# Patient Record
Sex: Male | Born: 1980 | Race: White | Hispanic: No | Marital: Single | Smoking: Current every day smoker
Health system: Southern US, Community
[De-identification: ages and names within clinical notes are randomized; demographics above are authoritative.]

## PROBLEM LIST (undated history)

## (undated) DIAGNOSIS — Q249 Congenital malformation of heart, unspecified: Secondary | ICD-10-CM

## (undated) DIAGNOSIS — I499 Cardiac arrhythmia, unspecified: Secondary | ICD-10-CM

## (undated) HISTORY — PX: SURGERY SCROTAL / TESTICULAR: SUR1316

---

## 2010-07-26 ENCOUNTER — Emergency Department (HOSPITAL_COMMUNITY)
Admission: EM | Admit: 2010-07-26 | Discharge: 2010-07-26 | Payer: Self-pay | Source: Home / Self Care | Admitting: Emergency Medicine

## 2015-02-21 ENCOUNTER — Encounter (HOSPITAL_COMMUNITY): Payer: Self-pay | Admitting: Emergency Medicine

## 2015-02-21 ENCOUNTER — Inpatient Hospital Stay (HOSPITAL_COMMUNITY)
Admission: EM | Admit: 2015-02-21 | Discharge: 2015-02-22 | DRG: 201 | Discharge status: Against Medical Advice | Payer: Self-pay | Attending: Pulmonary Disease | Admitting: Pulmonary Disease

## 2015-02-21 ENCOUNTER — Emergency Department (HOSPITAL_COMMUNITY): Payer: Self-pay

## 2015-02-21 DIAGNOSIS — J9383 Other pneumothorax: Principal | ICD-10-CM

## 2015-02-21 DIAGNOSIS — F17210 Nicotine dependence, cigarettes, uncomplicated: Secondary | ICD-10-CM | POA: Diagnosis present

## 2015-02-21 DIAGNOSIS — J939 Pneumothorax, unspecified: Secondary | ICD-10-CM

## 2015-02-21 DIAGNOSIS — R0781 Pleurodynia: Secondary | ICD-10-CM | POA: Diagnosis present

## 2015-02-21 HISTORY — DX: Cardiac arrhythmia, unspecified: I49.9

## 2015-02-21 HISTORY — DX: Congenital malformation of heart, unspecified: Q24.9

## 2015-02-21 LAB — CBC
HCT: 49 % (ref 39.0–52.0)
Hemoglobin: 17 g/dL (ref 13.0–17.0)
MCH: 32.8 pg (ref 26.0–34.0)
MCHC: 34.7 g/dL (ref 30.0–36.0)
MCV: 94.4 fL (ref 78.0–100.0)
Platelets: 228 10*3/uL (ref 150–400)
RBC: 5.19 MIL/uL (ref 4.22–5.81)
RDW: 13.6 % (ref 11.5–15.5)
WBC: 12.7 10*3/uL — ABNORMAL HIGH (ref 4.0–10.5)

## 2015-02-21 LAB — BASIC METABOLIC PANEL
Anion gap: 12 (ref 5–15)
BUN: 17 mg/dL (ref 6–20)
CALCIUM: 9.4 mg/dL (ref 8.9–10.3)
CO2: 23 mmol/L (ref 22–32)
Chloride: 102 mmol/L (ref 101–111)
Creatinine, Ser: 0.81 mg/dL (ref 0.61–1.24)
GLUCOSE: 80 mg/dL (ref 65–99)
POTASSIUM: 3.8 mmol/L (ref 3.5–5.1)
SODIUM: 137 mmol/L (ref 135–145)

## 2015-02-21 LAB — I-STAT TROPONIN, ED: TROPONIN I, POC: 0 ng/mL (ref 0.00–0.08)

## 2015-02-21 MED ORDER — HYDROMORPHONE HCL 1 MG/ML IJ SOLN
1.0000 mg | Freq: Once | INTRAMUSCULAR | Status: AC
  Administered 2015-02-21: 1 mg via INTRAVENOUS
  Filled 2015-02-21: qty 1

## 2015-02-21 NOTE — ED Notes (Signed)
Pulmonologist at bedside

## 2015-02-21 NOTE — ED Notes (Signed)
Pt presents to ED c/o left sided chest pain rated 9/10 and radiating to left arm and left neck.  Also reports dizziness, lightheadedness, and sweating.  Pt states he has had similar chest pain before years ago and has small cardiac ventricles, arrhythmias, and occasional syncope.  He does not take medications.  No nausea or vomiting at this time. ED EKG performed in triage.

## 2015-02-21 NOTE — ED Provider Notes (Signed)
CSN: [id number]     Arrival date & time 02/21/15  2224 History   First MD Initiated Contact with Patient 02/21/15 2315     Chief Complaint  Patient presents with  . Chest Pain     (Consider location/radiation/quality/duration/timing/severity/associated sxs/prior Treatment) HPI Patient presents with acute onset left sided sharp chest pain starting around 8:30 this evening. Patient states he was watching television when the pain started. Pain radiates to neck and left arm. Pain is worse with deep breathing. No lower extremity swelling or pain. Patient is a current 1 pack per day smoker for the past 20 years. No recent surgeries or extended travel. Past Medical History  Diagnosis Date  . Congenital heart defect     small ventricles  . Cardiac arrhythmia    Past Surgical History  Procedure Laterality Date  . Surgery scrotal / testicular     History reviewed. No pertinent family history. History  Substance Use Topics  . Smoking status: Current Every Day Smoker -- 1.00 packs/day for 20 years    Types: Cigarettes  . Smokeless tobacco: Never Used  . Alcohol Use: Yes     Comment: occasional    Review of Systems  Constitutional: Negative for fever and chills.  Respiratory: Positive for shortness of breath. Negative for cough.   Cardiovascular: Positive for chest pain. Negative for palpitations and leg swelling.  Gastrointestinal: Negative for nausea, vomiting and abdominal pain.  Musculoskeletal: Positive for neck pain. Negative for back pain and neck stiffness.  Skin: Negative for rash and wound.  Neurological: Negative for dizziness, weakness, light-headedness, numbness and headaches.  All other systems reviewed and are negative.     Allergies  Review of patient's allergies indicates no known allergies.  Home Medications   Prior to Admission medications   Not on File   BP 141/78 mmHg  Pulse 83  Temp(Src) 98.4 F (36.9 C) (Oral)  Resp 16  Ht 6\' 2"  (1.88 m)  Wt 149 lb  7.6 oz (67.8 kg)  BMI 19.18 kg/m2  SpO2 96% Physical Exam  Constitutional: He is oriented to person, place, and time. He appears well-developed and well-nourished. No distress.  HENT:  Head: Normocephalic and atraumatic.  Mouth/Throat: Oropharynx is clear and moist.  Eyes: EOM are normal. Pupils are equal, round, and reactive to light.  Neck: Normal range of motion. Neck supple.  Cardiovascular: Normal rate and regular rhythm.   Pulmonary/Chest: Effort normal. No respiratory distress. He has no wheezes. He has no rales. He exhibits no tenderness.  Decreased breath sounds in the left lung fields  Abdominal: Soft. Bowel sounds are normal. He exhibits no distension and no mass. There is no tenderness. There is no rebound and no guarding.  Musculoskeletal: Normal range of motion. He exhibits no edema or tenderness.  No calf swelling or tenderness.  Neurological: He is alert and oriented to person, place, and time.  Skin: Skin is warm and dry. No rash noted. No erythema.  Psychiatric: He has a normal mood and affect. His behavior is normal.  Nursing note and vitals reviewed.   ED Course  Procedures (including critical care time) Labs Review Labs Reviewed  CBC - Abnormal; Notable for the following:    WBC 12.7 (*)    All other components within normal limits  MRSA PCR SCREENING  BASIC METABOLIC PANEL  Rosezena SensorI-STAT TROPOININ, ED    Imaging Review Dg Chest Port 1 View  02/22/2015   CLINICAL DATA:  Pneumothorax, followup  EXAM: PORTABLE CHEST - 1  VIEW  COMPARISON:  Portable exam 1219 hours compared to 0438 hours  FINDINGS: Normal heart size, mediastinal contours and pulmonary vascularity.  Persistent small to moderate LEFT apex pneumothorax little changed from previous exam.  Underlying emphysematous changes.  Minimal subsegmental atelectasis at LEFT base.  Lungs otherwise clear.  No pleural effusion or acute osseous findings.  IMPRESSION: Persistent small to moderate LEFT apex pneumothorax not  significantly changed.   Electronically Signed   By: Ulyses Southward M.D.   On: 02/22/2015 12:28   Dg Chest Port 1 View  02/22/2015   CLINICAL DATA:  Pneumothorax.  EXAM: PORTABLE CHEST - 1 VIEW  COMPARISON:  02/21/2015.  FINDINGS: Trachea is midline. Heart size normal. Moderate left apical pneumothorax has increased slightly in the interval. Right apical pleural parenchymal scarring. There may be atelectasis in the lingula and medial left lower lobe. Lungs are otherwise clear. No definite pleural fluid.  IMPRESSION: Moderate left pneumothorax, slightly increased in size from yesterday's exam.   Electronically Signed   By: Leanna Battles M.D.   On: 02/22/2015 07:07   Dg Chest Port 1 View  02/21/2015   CLINICAL DATA:  Left-sided chest pain radiating into the neck and left arm.  EXAM: PORTABLE CHEST - 1 VIEW  COMPARISON:  None.  FINDINGS: There is a left pneumothorax, approximately 20% volume. The right lung is well expanded and clear. There is no midline shift. Hilar, mediastinal and cardiac contours are normal. There is no large effusion.  IMPRESSION: Left pneumothorax. These results were called by telephone at the time of interpretation on 02/21/2015 at 11:09 pm to the ED physician, who verbally acknowledged these results.   Electronically Signed   By: Ellery Plunk M.D.   On: 02/21/2015 23:10     EKG Interpretation   Date/Time:  Tuesday February 21 2015 22:39:43 EDT Ventricular Rate:  94 PR Interval:  136 QRS Duration: 86 QT Interval:  360 QTC Calculation: 450 R Axis:   50 Text Interpretation:  Normal sinus rhythm Nonspecific ST and T wave  abnormality Abnormal ECG Confirmed by Dejae Bernet  MD, Kirstan Fentress ([id number]) on  02/21/2015 11:53:42 PM      MDM   Final diagnoses:  Spontaneous pneumothorax  Pneumothorax  Pneumothorax, left    Pt with L sided PTX without shift. VS stable. Discussed with CCM and will see in ED.     Loren Racer, MD 02/22/15 [phone]

## 2015-02-22 ENCOUNTER — Inpatient Hospital Stay (HOSPITAL_COMMUNITY): Payer: MEDICAID

## 2015-02-22 ENCOUNTER — Inpatient Hospital Stay (HOSPITAL_COMMUNITY): Payer: Self-pay

## 2015-02-22 DIAGNOSIS — J9383 Other pneumothorax: Secondary | ICD-10-CM | POA: Diagnosis present

## 2015-02-22 LAB — MRSA PCR SCREENING: MRSA BY PCR: NEGATIVE

## 2015-02-22 MED ORDER — SODIUM CHLORIDE 0.9 % IV SOLN: 250.0000 mL | INTRAVENOUS | Status: DC | PRN

## 2015-02-22 MED ORDER — ENOXAPARIN SODIUM 40 MG/0.4ML ~~LOC~~ SOLN
40.0000 mg | SUBCUTANEOUS | Status: DC
  Administered 2015-02-22: 40 mg via SUBCUTANEOUS
  Filled 2015-02-22: qty 0.4

## 2015-02-22 MED ORDER — ACETAMINOPHEN 325 MG PO TABS: 650.0000 mg | ORAL_TABLET | ORAL | Status: DC | PRN

## 2015-02-22 MED ORDER — HYDROMORPHONE HCL 1 MG/ML IJ SOLN
0.5000 mg | INTRAMUSCULAR | Status: DC | PRN
  Administered 2015-02-22 (×3): 0.5 mg via INTRAVENOUS
  Filled 2015-02-22 (×3): qty 1

## 2015-02-22 MED ORDER — ONDANSETRON HCL 4 MG/2ML IJ SOLN: 4.0000 mg | Freq: Four times a day (QID) | INTRAMUSCULAR | Status: DC | PRN

## 2015-02-22 NOTE — Progress Notes (Addendum)
eLink Physician-Brief Progress Note Patient Name: Yisroel RammingBilly F Martinezlopez DOB: 06/26/1981 MRN: [id number]  Date of Service  02/22/2015   HPI/Events of Note   Patient feels fine and feels nothing being done for him an feels this can be managed at home  eICU Interventions  Explained PTX and risk for resp arrest.  Get STAT CXR - if worsening - place wayne tube but if resolved /stable 0- aim to move to floor tonight and dc home 02/23/15   Check urine tox - he denies cocaine but admist to tobacco       Kymere Fullington 02/22/2015, 6:46 PM

## 2015-02-22 NOTE — H&P (Signed)
PULMONARY / CRITICAL CARE MEDICINE   Name: Scott Burke MRN: [id number] DOB: 21-Jul-1981    ADMISSION DATE:  02/21/2015 CONSULTATION DATE:  02/21/2015  REFERRING MD :  EDP  CHIEF COMPLAINT:  Shortness of breath, chest pain  INITIAL PRESENTATION: 34 year old smoker with no past medical history presented to the [facility] emergency department on 02/21/2015 with chest pain, found to have a spontaneous left pneumothorax.  STUDIES:    SIGNIFICANT EVENTS:    HISTORY OF PRESENT ILLNESS:  This is a 34 year old male who has a past medical history significant for distant history of syncope felt to be related to a congenital heart defect of uncertain etiology who came to the [facility] emergency department on 02/22/2015 complaining of chest pain. He said the pain started suddenly over the course of the day on June 28. He had been feeling well the days prior. The pain progressed and was associated with some shortness of breath. His family brought him to the emergency department. He said the pain did radiate to the left chest into the left shoulder. He was concerned that he was having a heart problem. He denied any sort of recent injury, fall, or chest trauma of any kind. He says that he smokes one pack of cigarettes daily. He denies fevers chills or cough. He denies leg swelling of any kind.  PAST MEDICAL HISTORY :   has a past medical history of Congenital heart defect and Cardiac arrhythmia.  has past surgical history that includes Surgery scrotal / testicular. Prior to Admission medications   Not on File   No Known Allergies  FAMILY HISTORY:  has no family status information on file.  SOCIAL HISTORY:  reports that he has been smoking Cigarettes.  He has a 20 pack-year smoking history. He has never used smokeless tobacco. He reports that he drinks alcohol. He reports that he does not use illicit drugs.  REVIEW OF SYSTEMS:   Gen: Denies fever, chills, weight change, fatigue, night  sweats HEENT: Denies blurred vision, double vision, hearing loss, tinnitus, sinus congestion, rhinorrhea, sore throat, neck stiffness, dysphagia PULM: Per history of present illness CV: Per history of present illness GI: Denies abdominal pain, nausea, vomiting, diarrhea, hematochezia, melena, constipation, change in bowel habits GU: Denies dysuria, hematuria, polyuria, oliguria, urethral discharge Endocrine: Denies hot or cold intolerance, polyuria, polyphagia or appetite change Derm: Denies rash, dry skin, scaling or peeling skin change Heme: Denies easy bruising, bleeding, bleeding gums Neuro: Denies headache, numbness, weakness, slurred speech, loss of memory or consciousness   SUBJECTIVE:   VITAL SIGNS: Temp:  [98.7 F (37.1 C)] 98.7 F (37.1 C) (06/28 2230) Pulse Rate:  [97] 97 (06/28 2230) Resp:  [20] 20 (06/28 2230) BP: (139)/(81) 139/81 mmHg (06/28 2230) SpO2:  [99 %] 99 % (06/28 2230) Weight:  [74.844 kg (165 lb)] 74.844 kg (165 lb) (06/28 2230) HEMODYNAMICS:   VENTILATOR SETTINGS:   INTAKE / OUTPUT: No intake or output data in the 24 hours ending 02/22/15 0007  PHYSICAL EXAMINATION: General:  Resting comfortably in bed Neuro:  Awake and alert, oriented 4, moves all extreme ease well HEENT:  NCAT, EOMI, oropharynx clear Cardiovascular:  Regular rate and rhythm no murmurs gallops rubs Lungs:  Clear to auscultation bilaterally, slightly diminished left side Abdomen:  Bowel sounds positive nontender nondistended Musculoskeletal:  Normal bulk and tone Skin:  Tattoos, no rash  LABS:  CBC  Recent Labs Lab 02/21/15 2254  WBC 12.7*  HGB 17.0  HCT 49.0  PLT 228  Coag's No results for input(s): APTT, INR in the last 168 hours. BMET  Recent Labs Lab 02/21/15 2254  NA 137  K 3.8  CL 102  CO2 23  BUN 17  CREATININE 0.81  GLUCOSE 80   Electrolytes  Recent Labs Lab 02/21/15 2254  CALCIUM 9.4   Sepsis Markers No results for input(s): LATICACIDVEN,  PROCALCITON, O2SATVEN in the last 168 hours. ABG No results for input(s): PHART, PCO2ART, PO2ART in the last 168 hours. Liver Enzymes No results for input(s): AST, ALT, ALKPHOS, BILITOT, ALBUMIN in the last 168 hours. Cardiac Enzymes No results for input(s): TROPONINI, PROBNP in the last 168 hours. Glucose No results for input(s): GLUCAP in the last 168 hours.  Imaging 02/21/2015 chest x-ray images personally reviewed there is a small left-sided pneumothorax no mediastinal shift   ASSESSMENT / PLAN:  PULMONARY  A: Left spontaneous pneumothorax in a smoker. He is tall and thin and this is likely idiopathic. He is currently hemostatically stable and breathing comfortably. On my review the chest x-ray this appears to be a small pneumothorax which is not causing significant symptoms at this time. There is no evidence of another underlying lung disease.  We discussed treatment options including oxygen therapy with observation overnight versus a chest tube. Considering the fact that his vital signs are stable and he is comfortable I recommended oxygen therapy with repeat chest x-ray in the morning. He was agreeable to this.  Tobacco abuse P:   Admit to stepdown Repeat chest x-ray in the morning or if symptoms worsen When necessary a lot of for pain control Monitor O2 saturation respiratory status carefully along with vital signs Rest on 100% nonrebreather overnight Counseled to quit smoking  CARDIOVASCULAR  A: History of congenital heart defect, normal cardiac exam on my exam P:  Plantar monitoring Monitor blood pressure carefully  RENAL A:  No acute issues P:   Monitor urine output  GASTROINTESTINAL A:  No acute issues P:   Regular diet  HEMATOLOGIC A:  No acute issues P:  Monitor for bleeding  INFECTIOUS A:  No acute issues P:   Monitor for fevers or chills  ENDOCRINE A:  No acute issues   P:     NEUROLOGIC A:  Acute issues P:      FAMILY  - Updates:  Mother updated bedside Heber Ganado, MD [place] [facility] Pager: [phone] Cell: [phone] After 3pm or if no response, call [phone] 02/22/2015, 12:07 AM

## 2015-02-22 NOTE — Progress Notes (Signed)
PULMONARY / CRITICAL CARE MEDICINE   Name: Scott RammingBilly F Burke MRN: [id number] DOB: Jan 20, 1981    ADMISSION DATE:  02/21/2015  REFERRING MD :  EDP  CHIEF COMPLAINT:  Shortness of breath, chest pain  INITIAL PRESENTATION: 34 year old smoker with no past medical history [facility] on 02/21/2015 with chest pain, found to have a spontaneous left pneumothorax.  STUDIES:   SIGNIFICANT EVENTS: 6/28 Admit with PTX  SUBJECTIVE:  Pt reports intermittent sharp chest pains on L, mild SOB   VITAL SIGNS: Temp:  [97.7 F (36.5 C)-98.7 F (37.1 C)] 97.7 F (36.5 C) (06/29 0415) Pulse Rate:  [58-97] 61 (06/29 0700) Resp:  [11-24] 16 (06/29 0700) BP: (115-139)/(53-82) 123/75 mmHg (06/29 0700) SpO2:  [96 %-100 %] 99 % (06/29 0700) Weight:  [149 lb 7.6 oz (67.8 kg)-165 lb (74.844 kg)] 149 lb 7.6 oz (67.8 kg) (06/29 0118)   INTAKE / OUTPUT: No intake or output data in the 24 hours ending 02/22/15 0753  PHYSICAL EXAMINATION: General:  Thin adult male, resting comfortably in bed Neuro:  Awake and alert, oriented 4, moves all extremities  HEENT:  NCAT, EOMI, oropharynx clear Cardiovascular:  Regular rate and rhythm no murmurs / gallops / rubs Lungs:  Clear to auscultation bilaterally, slightly diminished left side Abdomen:  Bowel sounds positive nontender nondistended Musculoskeletal:  Normal bulk and tone Skin:  Tattoos, no rash  LABS:  CBC  Recent Labs Lab 02/21/15 2254  WBC 12.7*  HGB 17.0  HCT 49.0  PLT 228    BMET  Recent Labs Lab 02/21/15 2254  NA 137  K 3.8  CL 102  CO2 23  BUN 17  CREATININE 0.81  GLUCOSE 80   Electrolytes  Recent Labs Lab 02/21/15 2254  CALCIUM 9.4    Imaging Dg Chest Port 1 View  02/22/2015   CLINICAL DATA:  Pneumothorax.  EXAM: PORTABLE CHEST - 1 VIEW  COMPARISON:  02/21/2015.  FINDINGS: Trachea is midline. Heart size normal. Moderate left apical pneumothorax has increased slightly in the interval.  Right apical pleural parenchymal scarring. There may be atelectasis in the lingula and medial left lower lobe. Lungs are otherwise clear. No definite pleural fluid.  IMPRESSION: Moderate left pneumothorax, slightly increased in size from yesterday's exam.   Electronically Signed   By: Leanna BattlesMelinda  Blietz M.D.   On: 02/22/2015 07:07   Dg Chest Port 1 View  02/21/2015   CLINICAL DATA:  Left-sided chest pain radiating into the neck and left arm.  EXAM: PORTABLE CHEST - 1 VIEW  COMPARISON:  None.  FINDINGS: There is a left pneumothorax, approximately 20% volume. The right lung is well expanded and clear. There is no midline shift. Hilar, mediastinal and cardiac contours are normal. There is no large effusion.  IMPRESSION: Left pneumothorax. These results were called by telephone at the time of interpretation on 02/21/2015 at 11:09 pm to the ED physician, who verbally acknowledged these results.   Electronically Signed   By: Ellery Plunkaniel R Mitchell M.D.   On: 02/21/2015 23:10     ASSESSMENT / PLAN:  Lt spontaneous PTX >> hemodynamics stable. P:   SDU monitoring  Trend CXR PRN dilaudid for pain control Monitor O2 saturation respiratory status carefully along with vital signs Continue 100% NRB for now, repeat film in 4 hours  Hold CT for now Counseled to quit smoking  History of congenital heart defect, normal cardiac exam on my exam P:  Plantar monitoring Monitor blood pressure carefully  Tobacco abuse.  P: Discuss importance of smoking abstinence   FAMILY  - Updates:  Patient updated am 6/29 on plan   Canary Brim, NP-C [facility] Pgr: [phone] or if no answer [phone] 02/22/2015, 7:53 AM   Reviewed above, examined.  He has mild Lt sided pleuritic chest pain.  Denies dyspnea.  No wheeze, heart rate regular, abd soft, no edema, normal strength.  Continue supplemental oxygen to wash out PTX, f/u CXR.  Defer chest tube placement for now >> if PTX gets bigger on f/u CXR,  then will need pig tail catheter.  Continue to monitor in SDU for now.  Coralyn Helling, MD [facility] Pulmonary/Critical Care 02/22/2015, 9:39 AM Pager:  [phone] After 3pm call: [phone]

## 2015-02-22 NOTE — Progress Notes (Signed)
Date:  February 22, 2015 U.R. performed for needs and level of care. Will continue to follow for Case Management needs.  Caidyn Henricksen, RN, BSN, CCM   [phone] 

## 2015-02-22 NOTE — Progress Notes (Signed)
Patient is new admit from the ED. He is A/Ox4 and was transported by stretcher with non-rebreather mask and oxygen at 15 L. He was accompanied by his mother. He has had c/o pain during the shift and prn pain medication was given and effective. No signs of respiratory distress. Oxygen saturations remained above 90% between 97-100% on non-rebreather mask.

## 2015-02-22 NOTE — Progress Notes (Signed)
02/22/15 1900  Patient refused xray and decided to leave AMA. AMA form signed by patient. Patient IV's were removed. Patient was advised to not smoke, to take good deep breaths and cough, and to return to any ED if SOB or CP increases. Patient verbalized understanding.

## 2015-02-22 NOTE — Care Management Note (Signed)
Case Management Note  Patient Details  Name: Scott Burke MRN: [id number] Date of Birth: 12/20/1980  Subjective/Objective:                 pneumothorax    Action/Plan: home   Expected Discharge Date:                  Expected Discharge Plan:  Home/Self Care  In-House Referral:  NA  Discharge planning Services  CM Consult  Post Acute Care Choice:    Choice offered to:     DME Arranged:  N/A DME Agency:  NA  HH Arranged:  NA HH Agency:  NA  Status of Service:  In process, will continue to follow  Medicare Important Message Given:    Date Medicare IM Given:    Medicare IM give by:    Date Additional Medicare IM Given:    Additional Medicare Important Message give by:     If discussed at Long Length of Stay Meetings, dates discussed:    Additional Comments:  Golda AcreDavis, Mauriana Dann Lynn, RN 02/22/2015, 9:35 AM

## 2015-03-08 NOTE — Discharge Summary (Signed)
Physician Discharge Summary       Patient ID: Yisroel RammingBilly F Bozman MRN: [id number] DOB/AGE: [birth year] 34 y.o.  Admit date: 02/21/2015 Discharge date: 03/08/2015  Discharge Diagnoses:  Left spontaneous pneumothorax Tobacco abuse  Detailed Hospital Course:   34 year old smoker with no past medical history [facility] department on 02/21/2015 with chest pain, found to have a spontaneous left pneumothorax. He was admitted to the SDU for monitoring. On 6/29 he left AMA stating that "he felt fine". Refused f/u CXR to ensure safety.     Discharge Plan by active problems   Lt spontaneous PTX >> hemodynamics stable. Tobacco abuse. Patient left against medical advice: no plan was made   Significant Hospital tests/ studies  Consults   Discharge Exam: BP 141/78 mmHg  Pulse 83  Temp(Src) 98.4 F (36.9 C) (Oral)  Resp 16  Ht 6\' 2"  (1.88 m)  Wt 67.8 kg (149 lb 7.6 oz)  BMI 19.18 kg/m2  SpO2 96%  Not examined. Left against medical advice.   Labs at discharge Lab Results  Component Value Date   CREATININE 0.81 02/21/2015   BUN 17 02/21/2015   NA 137 02/21/2015   K 3.8 02/21/2015   CL 102 02/21/2015   CO2 23 02/21/2015   Lab Results  Component Value Date   WBC 12.7* 02/21/2015   HGB 17.0 02/21/2015   HCT 49.0 02/21/2015   MCV 94.4 02/21/2015   PLT 228 02/21/2015   No results found for: ALT, AST, GGT, ALKPHOS, BILITOT No results found for: INR, PROTIME  Current radiology studies No results found.  Disposition:  07-Left Against Medical Advice     Medication List    Notice    You have not been prescribed any medications.       Discharged Condition: left against medical advise   Physician Statement:   The Patient was personally examined, the discharge assessment and plan has been personally reviewed and I agree with ACNP Tauna Macfarlane's assessment and plan. > 30 minutes of time have been dedicated to discharge assessment, planning and  discharge instructions.   Signed: Koryn Charlot,PETE 03/08/2015, 3:31 PM

## 2015-11-12 IMAGING — DX DG CHEST 1V PORT
1 series · 2 of 2 positions shown · non-contrast
Comparison: 02/21/2015.

CLINICAL DATA: Pneumothorax.

EXAM:
PORTABLE CHEST - 1 VIEW

[Series 1: chest ap · 0.14mm/px · 2 of 2 slices shown]
[im 1/2]
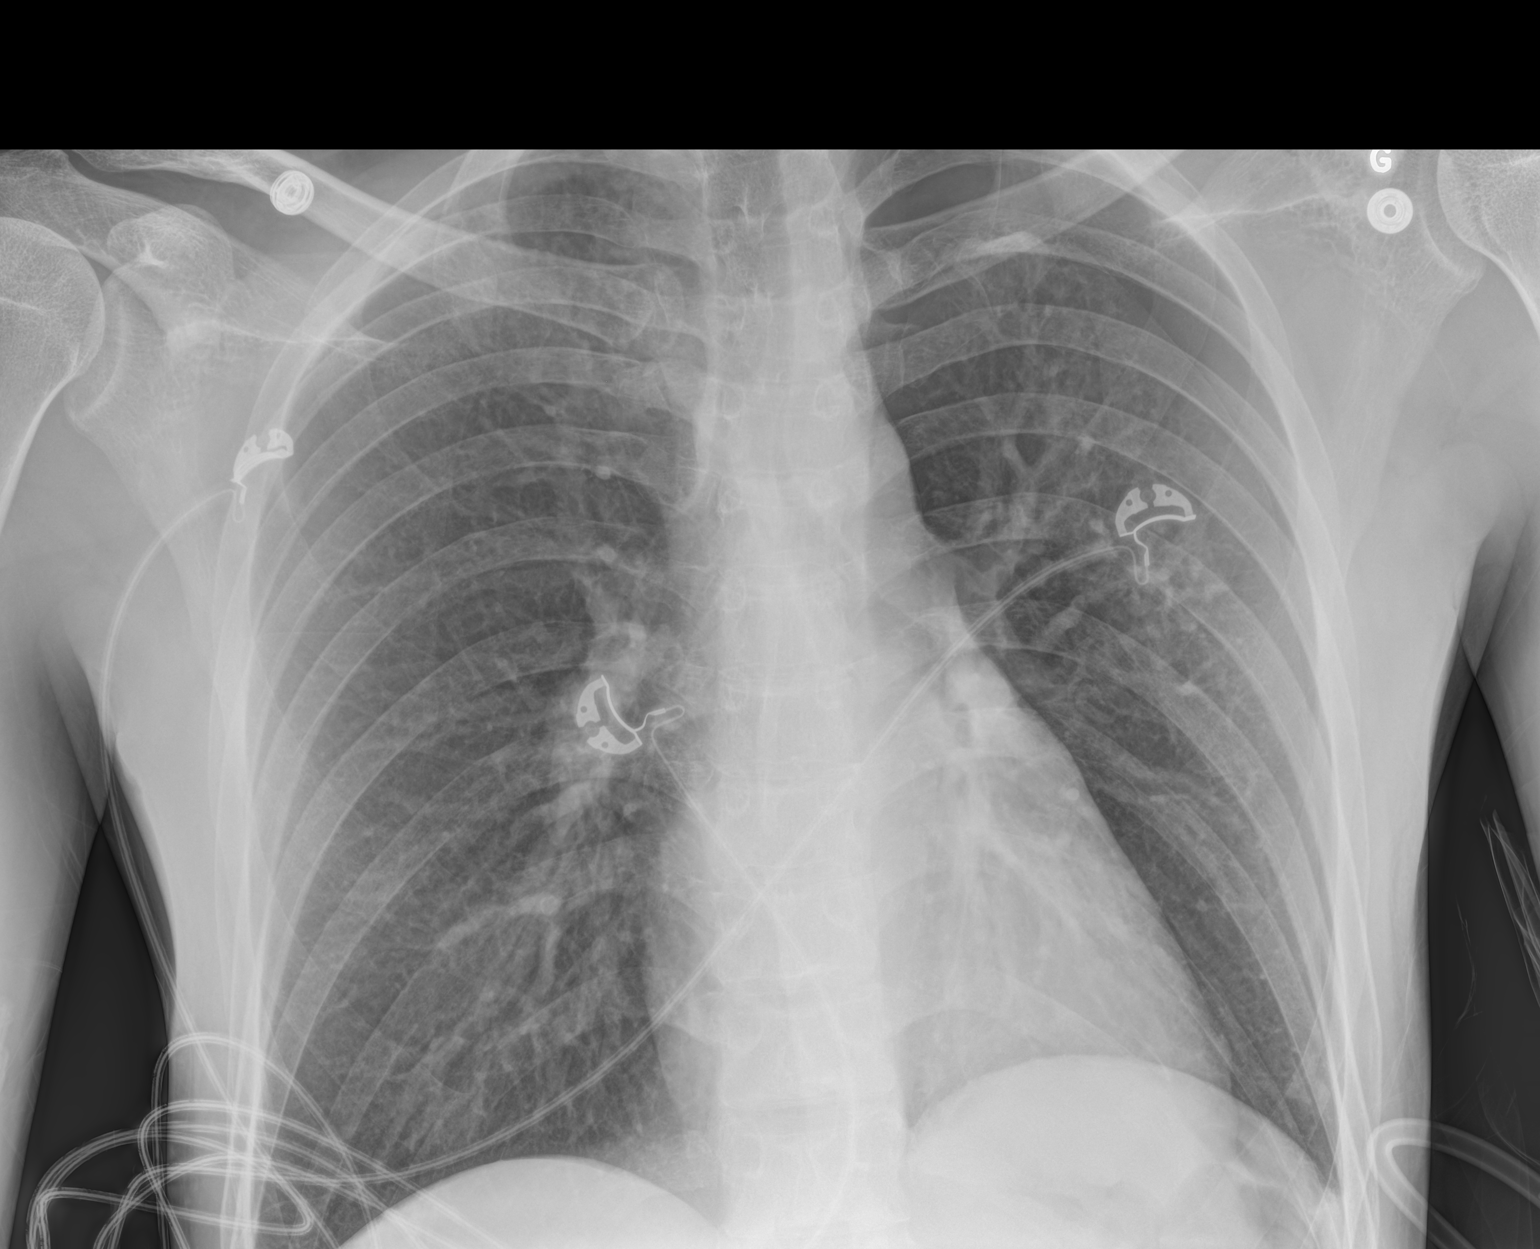
[im 2/2]
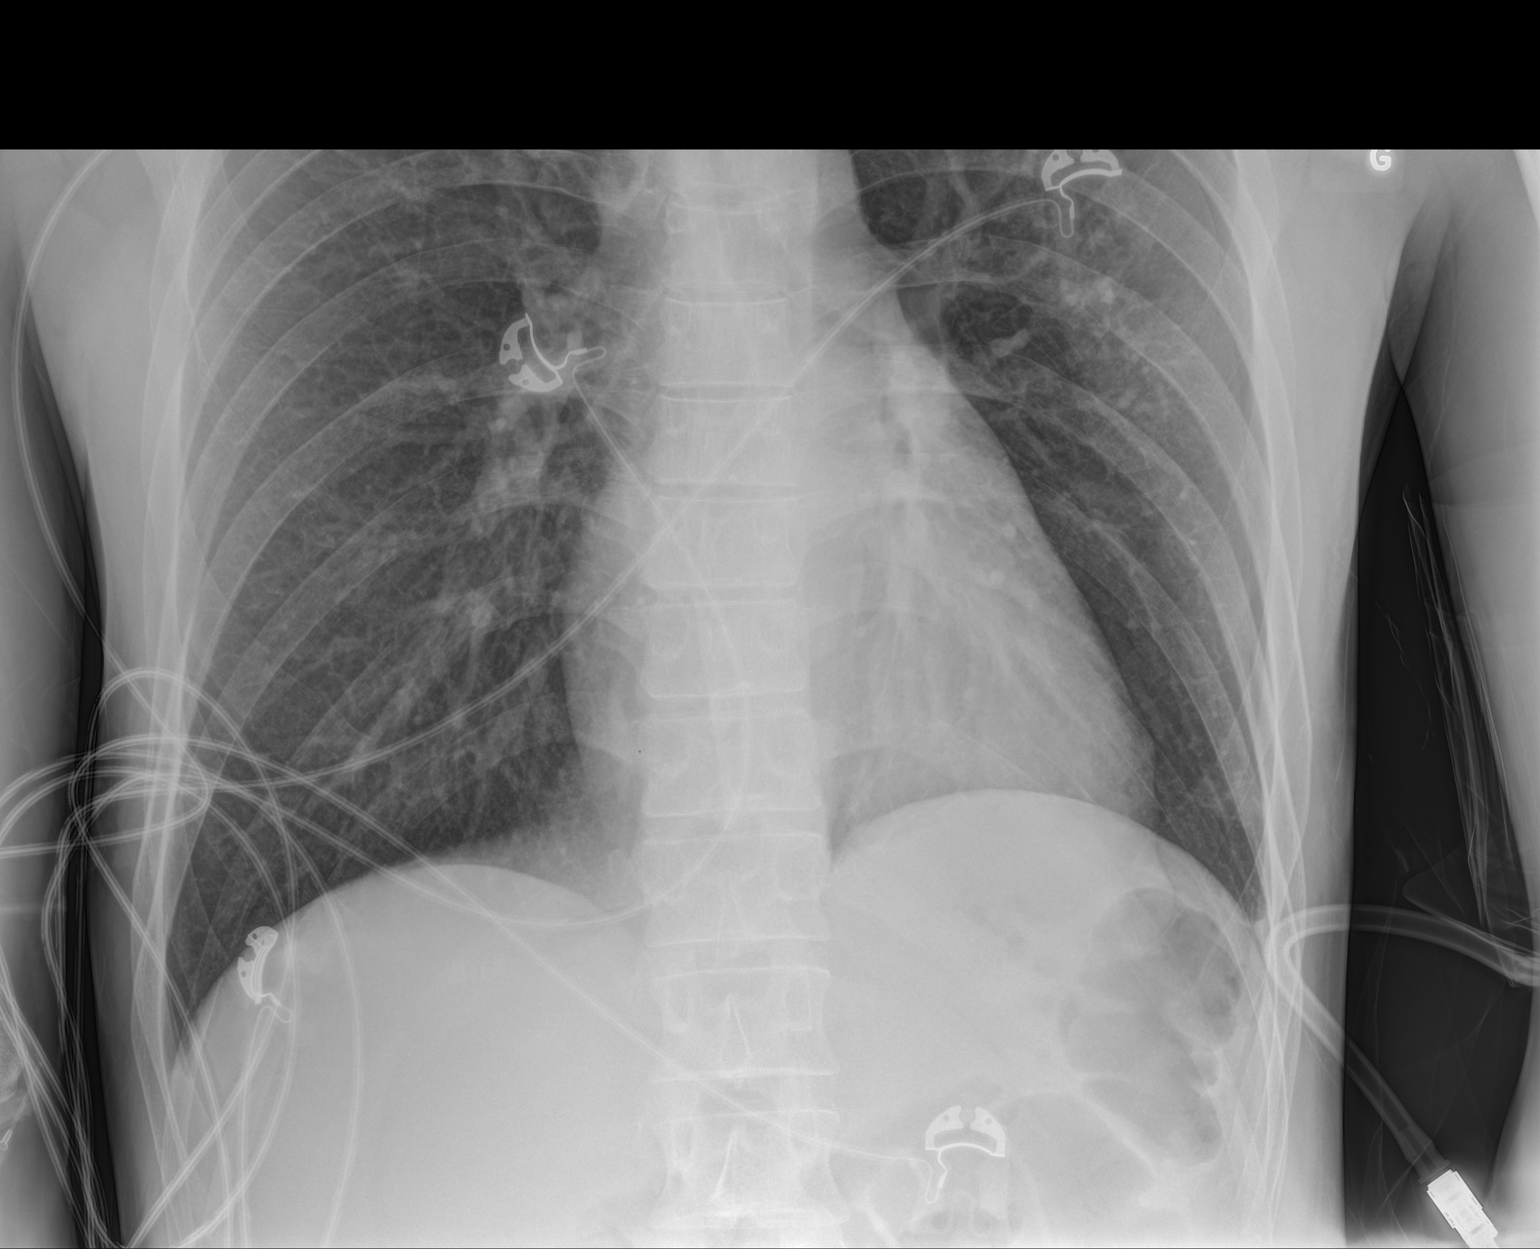

[2 of 2 positions shown; findings below may reference images not displayed]

FINDINGS: Trachea is midline. Heart size normal. Moderate left apical
pneumothorax has increased slightly in the interval. Right apical
pleural parenchymal scarring. There may be atelectasis in the
lingula and medial left lower lobe. Lungs are otherwise clear. No
definite pleural fluid.
IMPRESSION: Moderate left pneumothorax, slightly increased in size from
yesterday's exam.

## 2015-11-12 IMAGING — CR DG CHEST 1V PORT
1 series · 2 of 2 positions shown · non-contrast
Comparison: Portable exam 1813 hours compared to 6831 hours

CLINICAL DATA: Pneumothorax, followup

EXAM:
PORTABLE CHEST - 1 VIEW

[Series 1: AP · U · 2 of 2 slices shown]
[im 1/2]
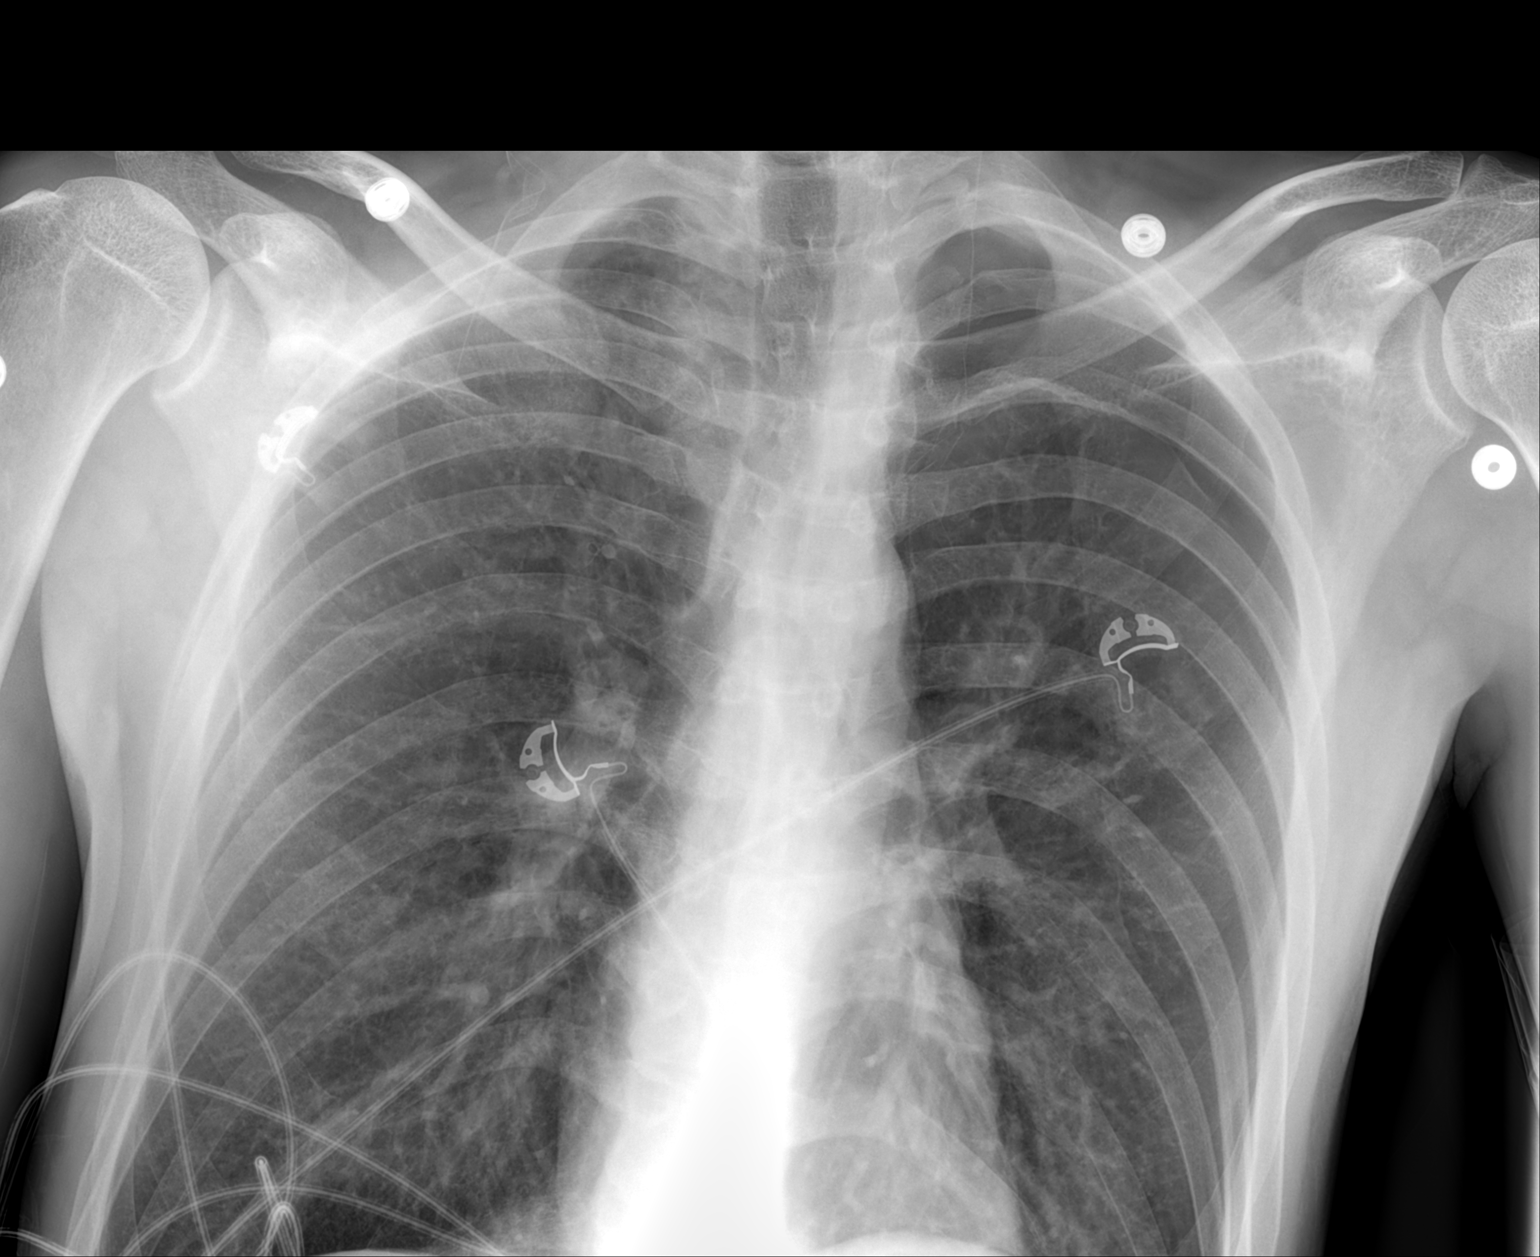
[im 2/2]
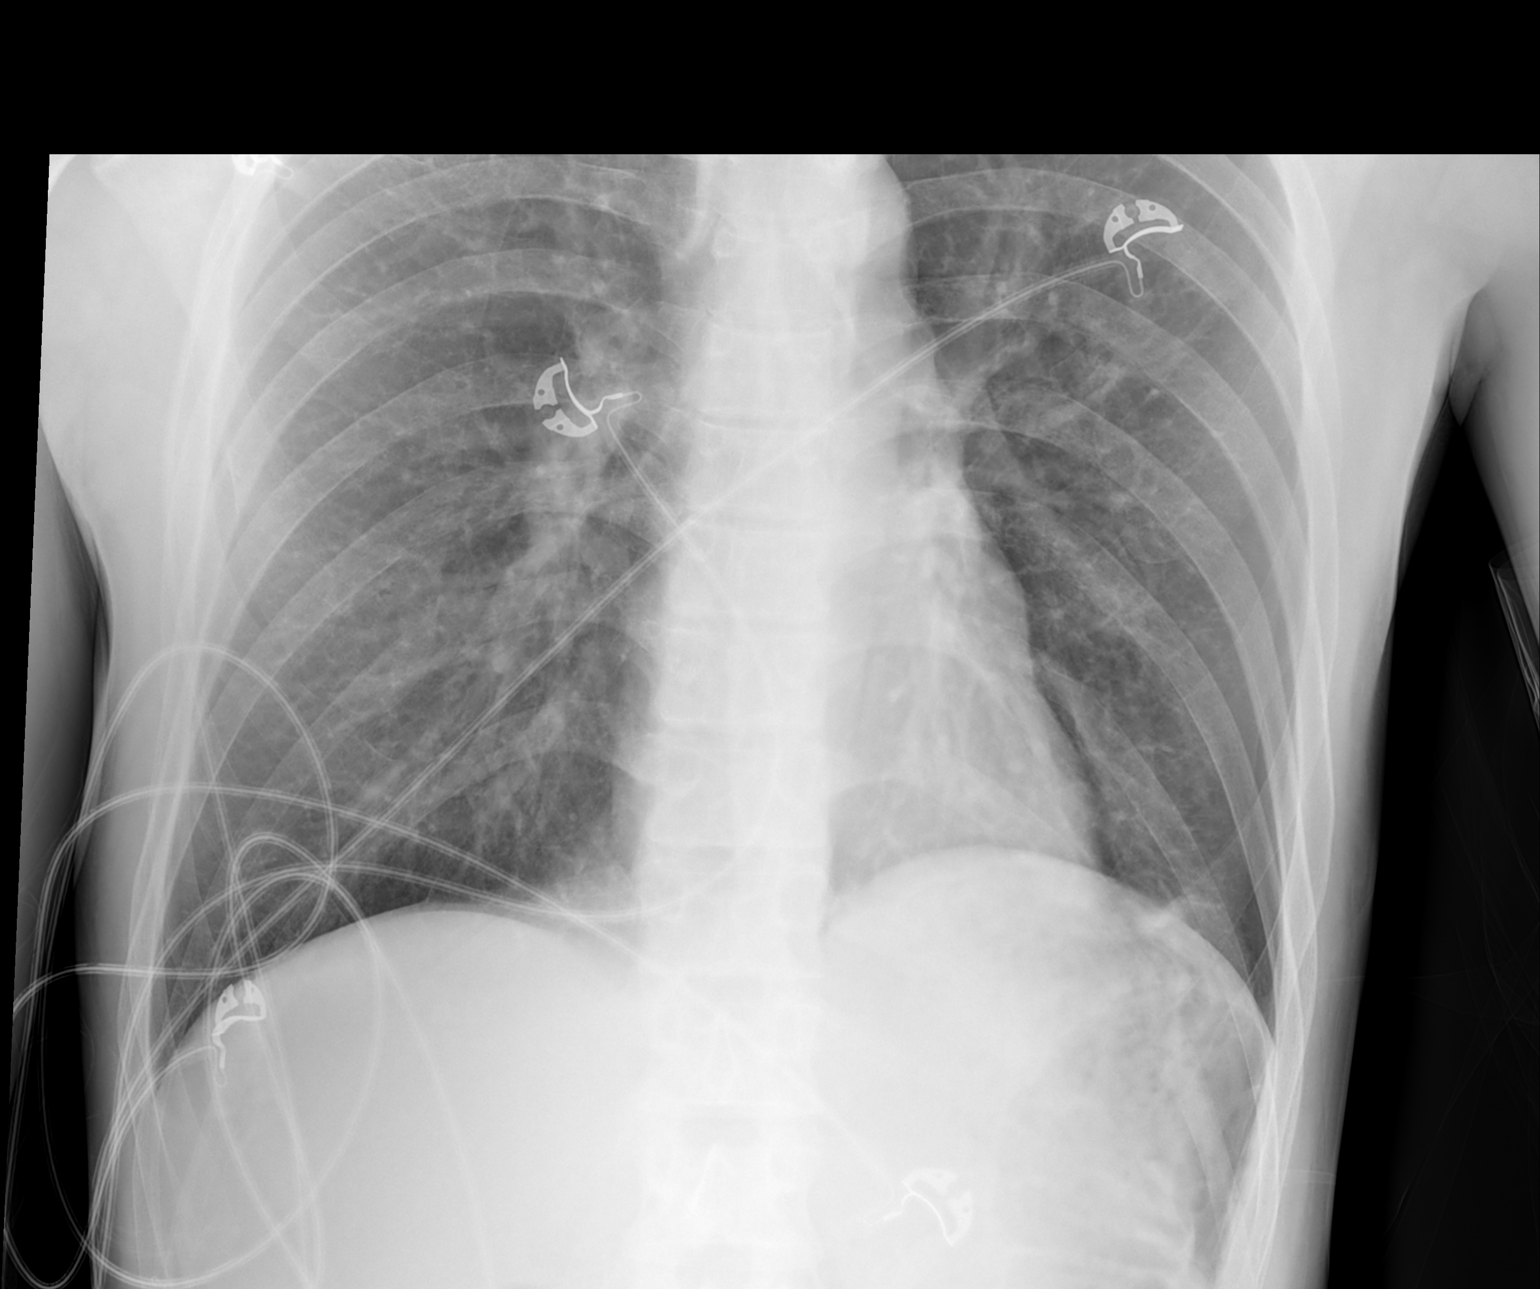

[2 of 2 positions shown; findings below may reference images not displayed]

FINDINGS: Normal heart size, mediastinal contours and pulmonary vascularity.

Persistent small to moderate LEFT apex pneumothorax little changed
from previous exam.

Underlying emphysematous changes.

Minimal subsegmental atelectasis at LEFT base.

Lungs otherwise clear.

No pleural effusion or acute osseous findings.
IMPRESSION: Persistent small to moderate LEFT apex pneumothorax not
significantly changed.

## 2020-05-09 ENCOUNTER — Ambulatory Visit
Admission: EM | Admit: 2020-05-09 | Discharge: 2020-05-09 | Disposition: A | Discharge status: Home / Self Care | Payer: Self-pay

## 2020-05-09 ENCOUNTER — Other Ambulatory Visit: Payer: Self-pay

## 2020-05-09 DIAGNOSIS — R197 Diarrhea, unspecified: Secondary | ICD-10-CM

## 2020-05-09 DIAGNOSIS — R112 Nausea with vomiting, unspecified: Secondary | ICD-10-CM

## 2020-05-09 NOTE — ED Triage Notes (Signed)
Pt presents with complaints of emesis and generalized abdominal pain that started 2 weeks ago. Reports he has felt good with no symptoms for 4 days. Is asking for a doctors note to return to work.

## 2020-05-09 NOTE — ED Provider Notes (Signed)
[facility]    CSN: [id number] Arrival date & time: 05/09/20  1757      History   Chief Complaint Chief Complaint  Patient presents with  . Emesis    HPI LANKFORD GUTZMER is a 39 y.o. male.   39 year old male comes in for work note to return to work.  2 weeks ago, started having nausea, vomiting, diarrhea.  Denies fever, URI symptoms, shortness of breath. Symptoms resolved 4-5 days ago. Denies any current complaint but requires work note to return.       Past Medical History:  Diagnosis Date  . Cardiac arrhythmia   . Congenital heart defect    small ventricles    Patient Active Problem List   Diagnosis Date Noted  . Spontaneous pneumothorax 02/22/2015    Past Surgical History:  Procedure Laterality Date  . SURGERY SCROTAL / TESTICULAR         Home Medications    Prior to Admission medications   Not on File    Family History Family History  Problem Relation Age of Onset  . Healthy Mother   . Healthy Father     Social History Social History   Tobacco Use  . Smoking status: Current Every Day Smoker    Packs/day: 1.00    Years: 20.00    Pack years: 20.00    Types: Cigarettes  . Smokeless tobacco: Never Used  Substance Use Topics  . Alcohol use: Yes    Comment: occasional  . Drug use: No     Allergies   Patient has no known allergies.   Review of Systems Review of Systems  Reason unable to perform ROS: See HPI as above.     Physical Exam Triage Vital Signs ED Triage Vitals  Enc Vitals Group     BP 05/09/20 1900 (!) 135/91     Pulse Rate 05/09/20 1900 97     Resp 05/09/20 1900 20     Temp 05/09/20 1900 99.5 F (37.5 C)     Temp Source 05/09/20 1900 Oral     SpO2 05/09/20 1900 97 %     Weight --      Height --      Head Circumference --      Peak Flow --      Pain Score 05/09/20 1916 0     Pain Loc --      Pain Edu? --      Excl. in GC? --    No data found.  Updated Vital Signs BP (!) 135/91 (BP  Location: Left Arm)   Pulse 97   Temp 99.5 F (37.5 C) (Oral)   Resp 20   SpO2 97%   Physical Exam Constitutional:      General: He is not in acute distress.    Appearance: Normal appearance. He is not ill-appearing, toxic-appearing or diaphoretic.  HENT:     Head: Normocephalic and atraumatic.  Cardiovascular:     Rate and Rhythm: Normal rate and regular rhythm.  Pulmonary:     Effort: Pulmonary effort is normal. No respiratory distress.     Comments: LCTAB Abdominal:     General: Bowel sounds are normal.     Palpations: Abdomen is soft.     Tenderness: There is no abdominal tenderness. There is no right CVA tenderness, left CVA tenderness, guarding or rebound.  Musculoskeletal:     Cervical back: Normal range of motion and neck supple.  Skin:    General:  Skin is warm and dry.  Neurological:     Mental Status: He is alert and oriented to person, place, and time.      UC Treatments / Results  Labs (all labs ordered are listed, but only abnormal results are displayed) Labs Reviewed - No data to display  EKG   Radiology No results found.  Procedures Procedures (including critical care time)  Medications Ordered in UC Medications - No data to display  Initial Impression / Assessment and Plan / UC Course  I have reviewed the triage vital signs and the nursing notes.  Pertinent labs & imaging results that were available during my care of the patient were reviewed by me and considered in my medical decision making (see chart for details).    Patient has been asymptomatic for 5 days. Exam reassuring.  Work note provided.  Final Clinical Impressions(s) / UC Diagnoses   Final diagnoses:  Nausea vomiting and diarrhea    ED Prescriptions    None     PDMP not reviewed this encounter.   Belinda Fisher, PA-C 05/09/20 1927

## 2020-05-09 NOTE — Discharge Instructions (Signed)
Okay to return to work
